# Patient Record
Sex: Female | Born: 1995 | Hispanic: Refuse to answer | Marital: Single | Smoking: Never smoker
Health system: Southern US, Community
[De-identification: ages and names within clinical notes are randomized; demographics above are authoritative.]

---

## 2017-11-05 ENCOUNTER — Other Ambulatory Visit: Payer: Self-pay | Admitting: Family Medicine

## 2017-11-05 ENCOUNTER — Other Ambulatory Visit: Payer: Self-pay

## 2017-11-05 DIAGNOSIS — M84376A Stress fracture, unspecified foot, initial encounter for fracture: Secondary | ICD-10-CM

## 2017-11-06 LAB — VITAMIN D 25 HYDROXY (VIT D DEFICIENCY, FRACTURES): VIT D 25 HYDROXY: 48.5 ng/mL (ref 30.0–100.0)

## 2017-11-06 LAB — SPECIMEN STATUS REPORT

## 2017-11-12 ENCOUNTER — Ambulatory Visit
Admission: RE | Admit: 2017-11-12 | Discharge: 2017-11-12 | Disposition: A | Discharge status: Home / Self Care | Payer: PRIVATE HEALTH INSURANCE | Source: Ambulatory Visit | Attending: Family Medicine | Admitting: Family Medicine

## 2017-11-12 ENCOUNTER — Other Ambulatory Visit: Payer: Self-pay | Admitting: Family Medicine

## 2017-11-12 DIAGNOSIS — M84375S Stress fracture, left foot, sequela: Secondary | ICD-10-CM | POA: Diagnosis not present

## 2017-11-12 DIAGNOSIS — M79672 Pain in left foot: Secondary | ICD-10-CM | POA: Diagnosis present

## 2017-11-12 DIAGNOSIS — X58XXXS Exposure to other specified factors, sequela: Secondary | ICD-10-CM | POA: Diagnosis not present

## 2017-11-18 ENCOUNTER — Ambulatory Visit
Admission: RE | Admit: 2017-11-18 | Discharge: 2017-11-18 | Disposition: A | Discharge status: Home / Self Care | Payer: PRIVATE HEALTH INSURANCE | Source: Ambulatory Visit | Attending: Family Medicine | Admitting: Family Medicine

## 2017-11-18 ENCOUNTER — Other Ambulatory Visit: Payer: Self-pay | Admitting: Family Medicine

## 2017-11-18 DIAGNOSIS — M84375A Stress fracture, left foot, initial encounter for fracture: Secondary | ICD-10-CM

## 2018-01-14 ENCOUNTER — Ambulatory Visit (INDEPENDENT_AMBULATORY_CARE_PROVIDER_SITE_OTHER): Payer: PRIVATE HEALTH INSURANCE | Admitting: Family Medicine

## 2018-01-14 ENCOUNTER — Encounter: Payer: Self-pay | Admitting: Family Medicine

## 2018-01-14 VITALS — BP 115/60 | HR 59 | Temp 98.7°F | Resp 14

## 2018-01-14 DIAGNOSIS — D508 Other iron deficiency anemias: Secondary | ICD-10-CM

## 2018-01-14 NOTE — Progress Notes (Signed)
Patient presents today for follow-up regarding her vitamin D level.  Patient states that she currently takes a vitamin D supplement however does not know how many units it is.  She also does take iron is been told in the past that she had iron deficiency anemia.  Currently she denies any fatigue, menstrual irregularities, chest pain, shortness of breath.  She denies any restrictions in her diet.  She has been dealing with a chronic stress injury in her left foot.  She is decreasing her running progression.  But by Dr. Ardine Eng regarding this. Today she denies any pain of her left foot.  ROS: Negative except mentioned above. Vitals as per Epic. GENERAL: NAD NEURO: CN II-XII grossly intact   A/P: History of Stress Injury to Left Foot - Vitamin D3 was checked in August and was 48.  Encouraged patient to continue Vitamin D3 2000 IUs daily.  Can recheck Vitamin D level in January.  Follow plan by Dr. Ardine Eng regarding treatment related to stress injury.  History of Iron Deficiency Anemia -we will check ferritin and iron studies, continue iron supplement for now, will inform patient if changes are needed to supplement.

## 2018-01-15 LAB — IRON AND TIBC
Iron Saturation: 31 % (ref 15–55)
Iron: 104 ug/dL (ref 27–159)
TIBC: 332 ug/dL (ref 250–450)
UIBC: 228 ug/dL (ref 131–425)

## 2018-01-15 LAB — FERRITIN: Ferritin: 51 ng/mL (ref 15–150)

## 2018-04-19 ENCOUNTER — Ambulatory Visit (INDEPENDENT_AMBULATORY_CARE_PROVIDER_SITE_OTHER): Payer: PRIVATE HEALTH INSURANCE | Admitting: Family Medicine

## 2018-04-19 ENCOUNTER — Encounter: Payer: Self-pay | Admitting: Family Medicine

## 2018-04-19 VITALS — BP 118/72 | HR 86 | Temp 98.3°F | Resp 14

## 2018-04-19 DIAGNOSIS — J101 Influenza due to other identified influenza virus with other respiratory manifestations: Secondary | ICD-10-CM

## 2018-04-19 DIAGNOSIS — J029 Acute pharyngitis, unspecified: Secondary | ICD-10-CM

## 2018-04-19 LAB — POCT INFLUENZA A/B
INFLUENZA A, POC: POSITIVE — AB
Influenza B, POC: NEGATIVE

## 2018-04-19 LAB — POCT RAPID STREP A (OFFICE): RAPID STREP A SCREEN: NEGATIVE

## 2018-04-19 MED ORDER — OSELTAMIVIR PHOSPHATE 75 MG PO CAPS: 75.0000 mg | ORAL_CAPSULE | Freq: Two times a day (BID) | ORAL | 0 refills | Status: AC

## 2018-04-19 NOTE — Progress Notes (Signed)
Patient presents today with symptoms of sore throat, cough, night sweats, myalgias.  Patient has had symptoms once yesterday.  She has taken some fever lowering medication at 3 AM this morning.  She does admit to getting the flu vaccination in November.  She denies any chest pain, shortness of breath, nausea, vomiting, diarrhea, abdominal pain, neck stiffness, photophobia.  She is unaware of any sick contacts.  She was doing athletic activity up until today.  ROS: Negative except mentioned above. Vitals as per Epic. GENERAL: NAD HEENT: mild pharyngeal erythema, no exudate, no erythema of TMs, positive scarring from previous infections right TM, no cervical LAD RESP: CTA B CARD: RRR NEURO: CN II-XII grossly intact   A/P: Influenza A - rapid strep test and influenza screen were done, influenza A positive, Tamiflu prescribed, rest, hydration, Tylenol/Ibuprofen as needed, Tussin as needed, no athletic activity or class until afebrile for 24 hours without taking fever lowering medication, seek medical attention if symptoms persist or worsen as discussed

## 2018-05-10 ENCOUNTER — Ambulatory Visit (INDEPENDENT_AMBULATORY_CARE_PROVIDER_SITE_OTHER): Payer: PRIVATE HEALTH INSURANCE | Admitting: Family Medicine

## 2018-05-10 VITALS — BP 114/57 | HR 60 | Temp 98.3°F | Resp 14

## 2018-05-10 DIAGNOSIS — E559 Vitamin D deficiency, unspecified: Secondary | ICD-10-CM | POA: Diagnosis not present

## 2018-05-10 DIAGNOSIS — J069 Acute upper respiratory infection, unspecified: Secondary | ICD-10-CM

## 2018-05-10 DIAGNOSIS — M79672 Pain in left foot: Secondary | ICD-10-CM | POA: Diagnosis not present

## 2018-05-10 NOTE — Progress Notes (Signed)
Patient presents today with left foot pain.  Patient states that her foot pain began on Friday.  She believes that her symptoms started because she tried new shoes and ran 6 miles in them.  She denies any pain to her foot prior to Friday.  Since that time she has taken time off from running and her symptoms have decreased considerably.  She notices some discomfort after activity.  Patient has a history of a known navicular fracture where she describes the pain as being.  She denies any pain walking at this time.  She denies having any bruising or swelling of the area recently. She denies any changes in her menstrual cycle or weight.  Her last vitamin D level was checked 10/2017 and was 49.  She has been taking vitamin D3 5000 IUs every other day. Patient also admits to having rhinorrhea and cough for the last few days.  She admits that she was in [place] for the meet this past weekend when her symptoms began.  She denies any fever, chest pain, shortness of breath, nausea, vomiting, diarrhea, headache.  The mucus has been mostly clear.  Any history of asthma.  She has not taken any medications for symptoms.   ROS: Negative except mentioned above. Vitals as per Epic. GENERAL: NAD HEENT: minimal pharyngeal erythema, no exudate, no erythema of TMs, no cervical LAD RESP: CTA B CARD: RRR MSK: Left Foot -no swelling or ecchymosis appreciated, no significant tenderness to palpation, FROM, negative hop test, normal gait, N/V intact NEURO: CN II-XII grossly intact   A/P: 1)Left Foot Pain -patient describes the pain as being over the navicular however today on exam she does not have any significant pain, would recommend that patient decrease mileage and run on even surfaces, would also recommend staying in running shoes that she has used in the past, encouraged changing out running shoes every 400 miles, would build in more crosstraining days and day off within her running schedule, vitamin D checked today, if  patient's pain persist or worsens by the end of the week will place patient in a walking boot and do imaging, patient addresses understanding of plan, will discuss above with athletic trainer.  2)Viral URI -recommend symptomatic relief with over-the-counter medicines such as Claritin, Delsym, Tylenol/Ibuprofen, rest, hydration, can go to class and do athletic activity if afebrile for 24 hours without taking fever lowering medication, if symptoms do persist or worsen recommend seeking medical attention, patient addresses understanding of plan.

## 2018-05-11 LAB — VITAMIN D 25 HYDROXY (VIT D DEFICIENCY, FRACTURES): VIT D 25 HYDROXY: 63.8 ng/mL (ref 30.0–100.0)

## 2018-05-23 ENCOUNTER — Other Ambulatory Visit: Payer: Self-pay | Admitting: Family Medicine

## 2018-05-23 DIAGNOSIS — M79672 Pain in left foot: Secondary | ICD-10-CM

## 2018-05-24 ENCOUNTER — Ambulatory Visit: Admission: RE | Admit: 2018-05-24 | Payer: PRIVATE HEALTH INSURANCE | Source: Ambulatory Visit

## 2018-05-24 ENCOUNTER — Other Ambulatory Visit: Payer: Self-pay

## 2018-05-24 ENCOUNTER — Ambulatory Visit
Admission: RE | Admit: 2018-05-24 | Discharge: 2018-05-24 | Disposition: A | Discharge status: Home / Self Care | Payer: PRIVATE HEALTH INSURANCE | Source: Ambulatory Visit | Attending: Family Medicine | Admitting: Family Medicine

## 2018-05-24 DIAGNOSIS — M79672 Pain in left foot: Secondary | ICD-10-CM | POA: Insufficient documentation

## 2018-05-27 ENCOUNTER — Ambulatory Visit: Payer: PRIVATE HEALTH INSURANCE

## 2019-01-12 ENCOUNTER — Other Ambulatory Visit: Payer: PRIVATE HEALTH INSURANCE | Admitting: Family Medicine

## 2019-01-12 ENCOUNTER — Other Ambulatory Visit: Payer: Self-pay

## 2019-01-12 DIAGNOSIS — R5383 Other fatigue: Secondary | ICD-10-CM

## 2019-01-13 LAB — SPECIMEN STATUS

## 2019-01-13 LAB — IRON AND TIBC
Iron Saturation: 52 % (ref 15–55)
Iron: 201 ug/dL — ABNORMAL HIGH (ref 27–159)
Total Iron Binding Capacity: 390 ug/dL (ref 250–450)
UIBC: 189 ug/dL (ref 131–425)

## 2019-01-13 LAB — FERRITIN: Ferritin: 62 ng/mL (ref 15–150)

## 2019-01-13 LAB — VITAMIN D 25 HYDROXY (VIT D DEFICIENCY, FRACTURES): Vit D, 25-Hydroxy: 45.8 ng/mL (ref 30.0–100.0)

## 2019-01-20 ENCOUNTER — Other Ambulatory Visit: Payer: Self-pay

## 2019-01-20 ENCOUNTER — Telehealth: Payer: PRIVATE HEALTH INSURANCE | Admitting: Family Medicine

## 2019-01-20 DIAGNOSIS — R5383 Other fatigue: Secondary | ICD-10-CM

## 2019-01-21 NOTE — Progress Notes (Signed)
Virtual Visit Agreed To By Patient   Patient states that she has had soreness. and fatigue after activity. Her coach feels she isn't able to recover quick enough. She has had some inconsistent training because of the restrictions with training due to COVID-19. Patient has not had COVID-19. She feels her body just needs to adjust back to increasing activity. She denies any changes in weight, sleep, appetite. She takes iron and magnesium. Has some muscle soreness 1-2 days after training. She says the soreness depends on what muscle group she is using. On occasion in the past has seen a ?drop of blood in urine but no "tea colored" urine or hematuria recently.   ROS: Negative except mentioned above. GENERAL: NAD No Exam.  A/P: Fatigue/Recovery Issues - concerns from coach per patient, lab results reviewed, ferritin is fine, serum iron slightly elevated, discouraged taking too much iron, Vitamin D in the 40s, will restart her Vitamin D3 supplement, recheck in 3 months, encouraged balanced diet, consider referral to nutritionist, hydrating with electrolyte beverage if doing >45 min aerobic activity, have cross-training and off days built into training, avoid increasing mileage >10% a week. Will discuss above with trainer as well. Follow-up in January, sooner if needed.

## 2019-07-31 ENCOUNTER — Other Ambulatory Visit: Payer: Self-pay

## 2019-07-31 ENCOUNTER — Other Ambulatory Visit: Payer: PRIVATE HEALTH INSURANCE | Admitting: Family

## 2019-07-31 VITALS — BP 113/66 | HR 69 | Temp 98.6°F | Resp 14

## 2019-07-31 DIAGNOSIS — Z130 Encounter for screening for diseases of the blood and blood-forming organs and certain disorders involving the immune mechanism: Secondary | ICD-10-CM

## 2019-07-31 DIAGNOSIS — D508 Other iron deficiency anemias: Secondary | ICD-10-CM

## 2019-07-31 DIAGNOSIS — D649 Anemia, unspecified: Secondary | ICD-10-CM | POA: Insufficient documentation

## 2019-07-31 NOTE — Progress Notes (Signed)
   Established Patient Office Visit  Subjective:  Patient ID: Shelby Gill, female    DOB: [birth year]  Age: 24 y.o. MRN: [id number]  CC:  Chief Complaint  Patient presents with  . Labs Only    Iron check.    HPI Shaneece Stockburger presents for iron blood test.  Pt has a history of iron deficiency anemia. She takes an iron supplement-Kendural-C which is a [place] otc supplement with 329 mg iron.  Pt feels well without any side effects of iron deficiency.  Pt want's to know if she should stop her supplementation.  No past medical history on file.  No past surgical history on file.  No family history on file.  No Known Allergies  ROS Review of Systems  Constitutional: Negative for activity change and fatigue.      Objective:    Physical Exam  Constitutional: She appears well-developed and well-nourished.    BP 113/66   Pulse 69   Temp 98.6 F (37 C) (Temporal)   Resp 14   SpO2 97%  Wt Readings from Last 3 Encounters:  No data found for Wt      Assessment & Plan:   Problem List Items Addressed This Visit    None    Visit Diagnoses    Screening, iron deficiency anemia    -  Primary   Relevant Orders   CBC with Differential   Ferritin   Iron and TIBC([facility])     Reviewed HPI and request. Will run CBC, Ferritin and Iron/TIBC. Will email results to pt.  Rtc prn.   Shantese Raven, Deirdre Peer, NP

## 2019-08-01 LAB — CBC WITH DIFFERENTIAL/PLATELET
Basophils Absolute: 0 10*3/uL (ref 0.0–0.2)
Basos: 1 %
EOS (ABSOLUTE): 0.1 10*3/uL (ref 0.0–0.4)
Eos: 1 %
Hematocrit: 46.8 % — ABNORMAL HIGH (ref 34.0–46.6)
Hemoglobin: 15.7 g/dL (ref 11.1–15.9)
Immature Grans (Abs): 0 10*3/uL (ref 0.0–0.1)
Immature Granulocytes: 0 %
Lymphocytes Absolute: 2 10*3/uL (ref 0.7–3.1)
Lymphs: 35 %
MCH: 31.8 pg (ref 26.6–33.0)
MCHC: 33.5 g/dL (ref 31.5–35.7)
MCV: 95 fL (ref 79–97)
Monocytes Absolute: 0.5 10*3/uL (ref 0.1–0.9)
Monocytes: 8 %
Neutrophils Absolute: 3.2 10*3/uL (ref 1.4–7.0)
Neutrophils: 55 %
Platelets: 235 10*3/uL (ref 150–450)
RBC: 4.93 x10E6/uL (ref 3.77–5.28)
RDW: 12.5 % (ref 11.7–15.4)
WBC: 5.8 10*3/uL (ref 3.4–10.8)

## 2019-08-01 LAB — IRON AND TIBC
Iron Saturation: 50 % (ref 15–55)
Iron: 175 ug/dL — ABNORMAL HIGH (ref 27–159)
Total Iron Binding Capacity: 350 ug/dL (ref 250–450)
UIBC: 175 ug/dL (ref 131–425)

## 2019-08-01 LAB — FERRITIN: Ferritin: 73 ng/mL (ref 15–150)

## 2020-10-01 IMAGING — MR MR FOOT*L* W/O CM
5 series · 40 of 40 positions shown · non-contrast
Comparison: CT scan 11/18/2017

CLINICAL DATA: History of navicular stress fracture [REDACTED] 4844.
Patient is a distance runner at [employer]. Persistent pain on the
top of the left foot.

EXAM:
MRI OF THE LEFT FOOT WITHOUT CONTRAST
TECHNIQUE: Multiplanar, multisequence MR imaging of the left foot was
performed. No intravenous contrast was administered.

[Series 4: PD fat-sat · axial · left · 3.0mm · 0.50mm/px · z∈[-98,+41]mm · 9 of 36 slices shown]
[im 1/36]
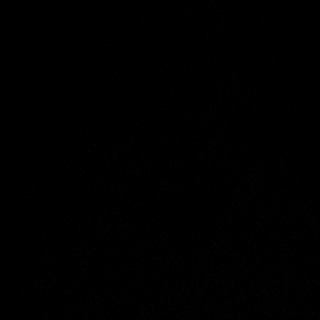
[im 5/36]
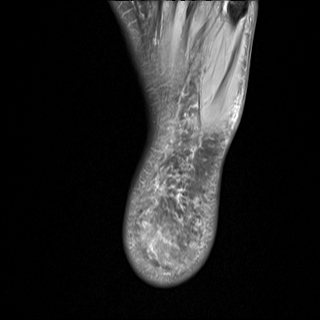
[im 9/36]
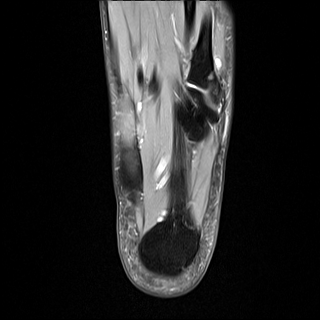
[im 14/36]
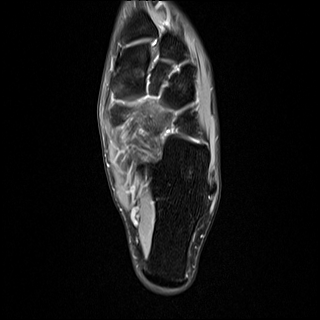
[im 18/36]
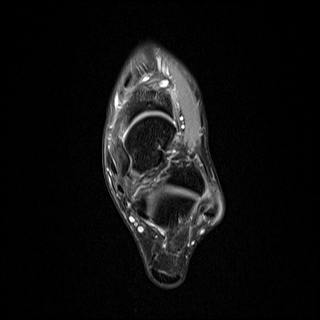
[im 22/36]
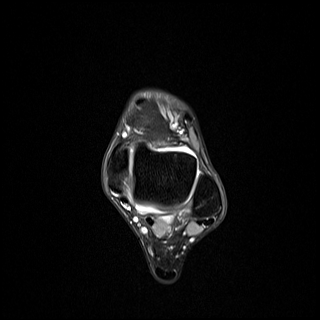
[im 27/36]
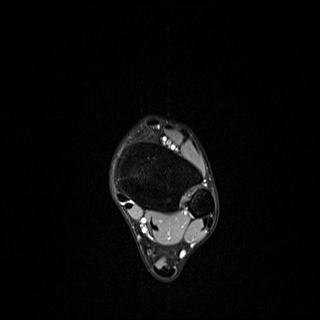
[im 31/36]
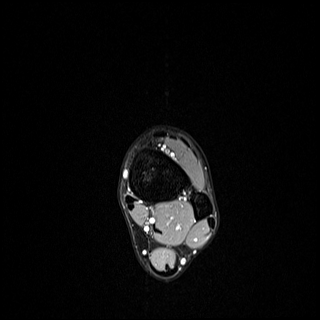
[im 36/36]
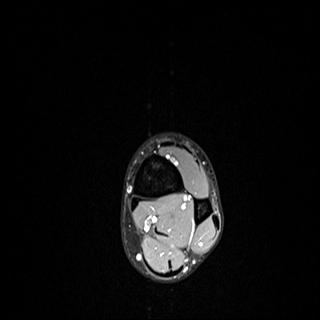

[Series 5: T2 fat-sat · axial · left · 3.0mm · 0.50mm/px · z∈[-98,+41]mm · 10 of 36 slices shown (1 of 2)]
[im 1/36]
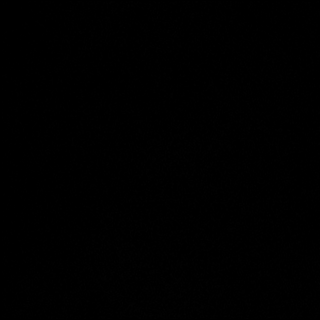
[im 4/36]
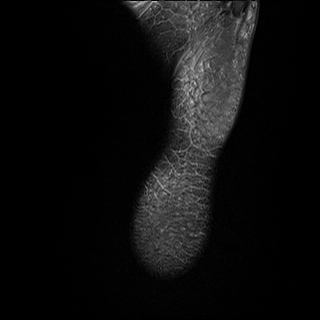
[im 8/36]
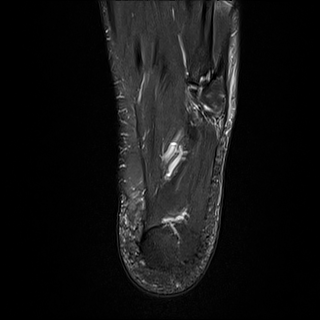
[im 12/36]
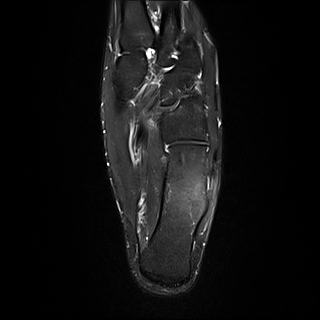
[im 16/36]
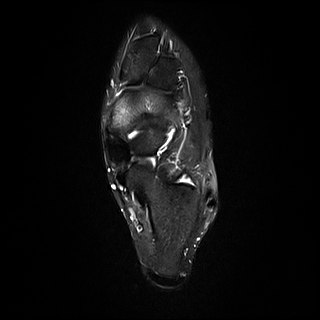
[im 20/36]
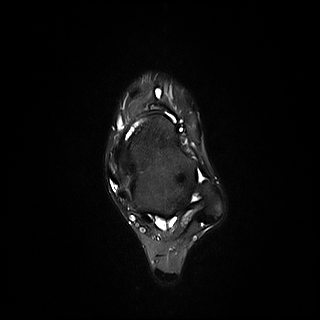
[im 24/36]
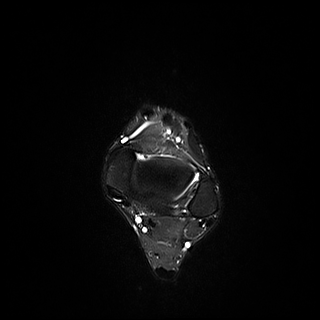
[im 28/36]
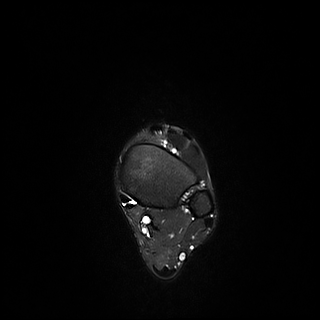
[im 32/36]
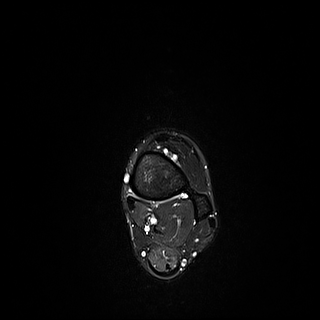
[im 36/36]
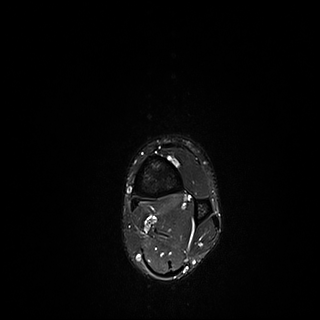

[Series 6: T2 fat-sat · coronal · left · 3.0mm · 0.62mm/px · 11 of 40 slices shown (2 of 2)]
[im 1/40]
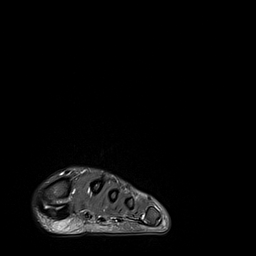
[im 4/40]
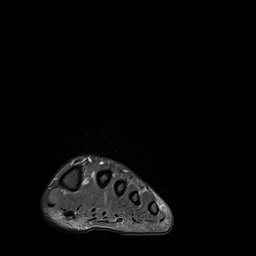
[im 8/40]
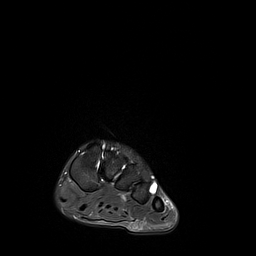
[im 12/40]
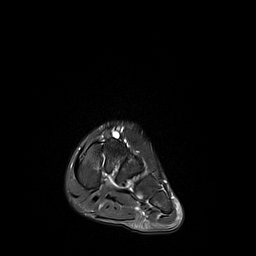
[im 16/40]
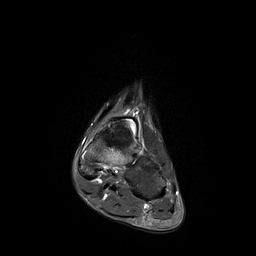
[im 20/40]
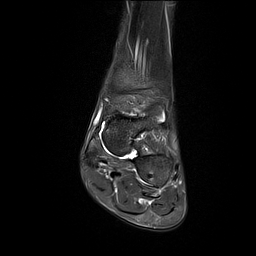
[im 24/40]
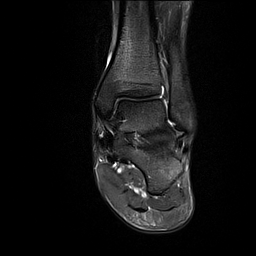
[im 28/40]
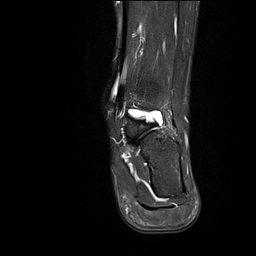
[im 32/40]
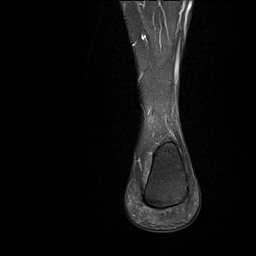
[im 36/40]
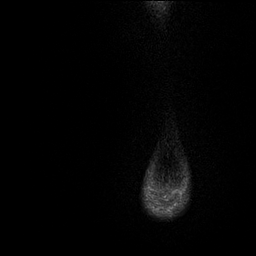
[im 40/40]
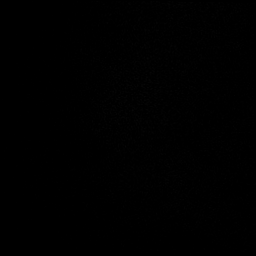

[Series 7: T1 · sagittal · left · 4.0mm · 0.70mm/px · 5 of 18 slices shown]
[im 1/18]
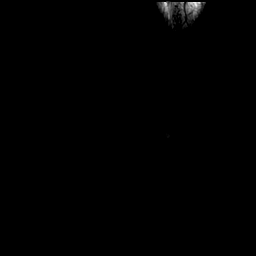
[im 5/18]
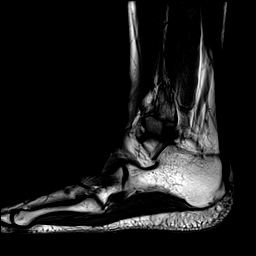
[im 9/18]
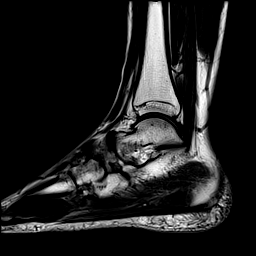
[im 13/18]
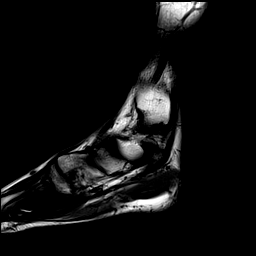
[im 18/18]
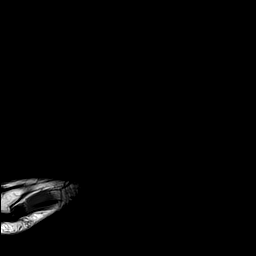

[Series 8: STIR · sagittal · left · 4.0mm · 0.35mm/px · 5 of 18 slices shown]
[im 1/18]
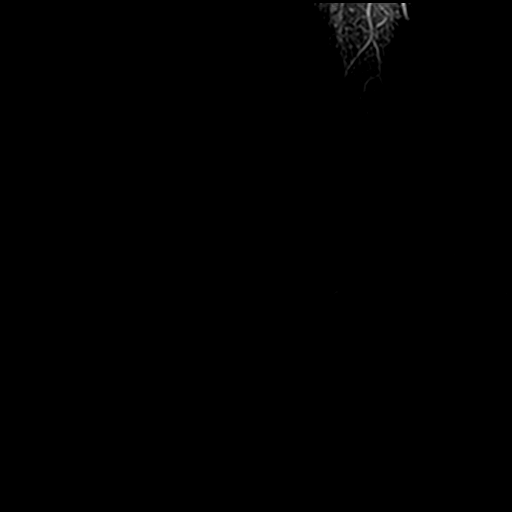
[im 5/18]
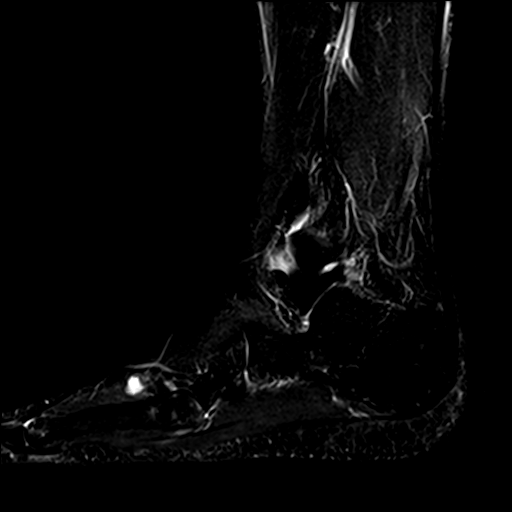
[im 9/18]
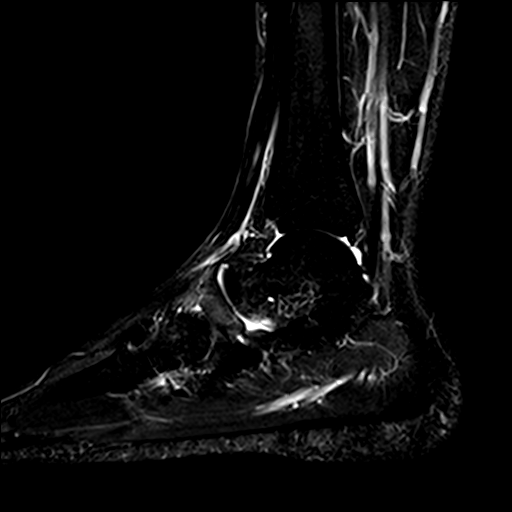
[im 13/18]
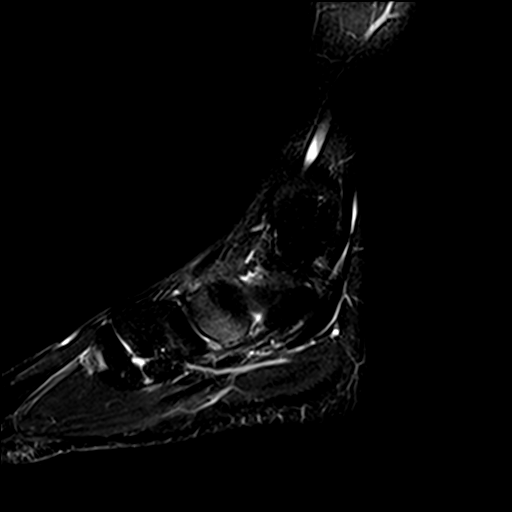
[im 18/18]
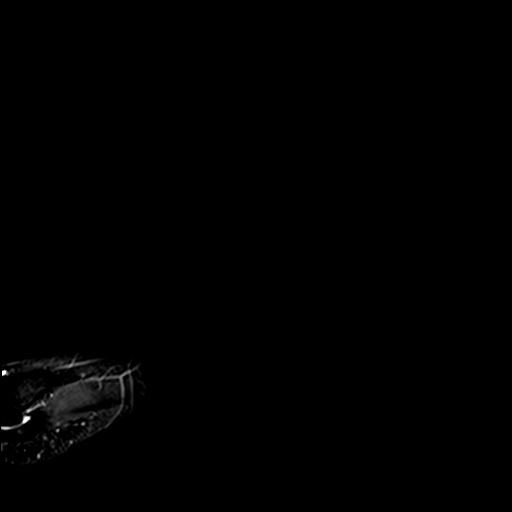

[40 of 40 positions shown; findings below may reference images not displayed]

FINDINGS: There is abnormal increased T2 and STIR signal intensity in the
navicular bone suggesting an ongoing stress related process. There
are also remote changes with a probable prior dorsal navicular
avulsion fracture with ununited fragment. There are also 2 defects
in the subchondral plate at the articulation with the talus. These
could be ununited subcortical fractures or possible osteochondral
lesions.

There is also dorsal degenerative spurring changes involving the
distal aspect of the talus. No talus stress fracture or
osteochondral lesion.

Benign-appearing sclerotic lesions are noted in the talus and
calcaneus which are likely benign bone islands.

There is also a focal fluid collection along the dorsum aspect of
the navicular bone which could be a ganglion cyst or adventitial
bursa.

The other bony structures are intact. Small tibiotalar joint
effusion but no significant degenerative changes, cartilage defects
or osteochondral lesion.

The medial and lateral ankle tendons are intact. Mild tenosynovitis
involving the posterior tibialis tendon and flexor digitorum longus
tendon. The anterior ankle tendons appear normal. The Achilles
tendon is normal. The plantar fascia is intact.

The medial and lateral ankle ligaments are intact.

The subtalar joints are maintained. The sinus tarsi is normal. The
cervical and interosseous ligaments are intact and the spring
ligament is intact.
IMPRESSION: 1. Edema like signal abnormality in the navicular bone suggesting a
persistent stress related process without discrete stress fracture.
2. There are age advanced degenerative changes at the talonavicular
joint with evidence of probable prior ununited dorsal avulsion
fracture and small subchondral defects involving the navicular bone.
Possible prior fractures or osteochondral lesions.
Small fluid collection along the dorsal aspect of the navicular bone
could be a small ganglion cyst or adventitial bursa.
3. Small tibiotalar joint effusion but no degenerative changes or
cartilage abnormality.
4. The major ligaments and tendons are intact.

## 2023-06-18 ENCOUNTER — Other Ambulatory Visit: Payer: Self-pay

## 2023-06-18 ENCOUNTER — Emergency Department
Admission: EM | Admit: 2023-06-18 | Discharge: 2023-06-18 | Disposition: A | Discharge status: Home / Self Care | Payer: PRIVATE HEALTH INSURANCE | Attending: Emergency Medicine | Admitting: Emergency Medicine

## 2023-06-18 DIAGNOSIS — J101 Influenza due to other identified influenza virus with other respiratory manifestations: Secondary | ICD-10-CM | POA: Diagnosis not present

## 2023-06-18 DIAGNOSIS — J029 Acute pharyngitis, unspecified: Secondary | ICD-10-CM

## 2023-06-18 DIAGNOSIS — R509 Fever, unspecified: Secondary | ICD-10-CM | POA: Diagnosis present

## 2023-06-18 LAB — RESP PANEL BY RT-PCR (RSV, FLU A&B, COVID)  RVPGX2
Influenza A by PCR: POSITIVE — AB
Influenza B by PCR: NEGATIVE
Resp Syncytial Virus by PCR: NEGATIVE
SARS Coronavirus 2 by RT PCR: NEGATIVE

## 2023-06-18 LAB — GROUP A STREP BY PCR: Group A Strep by PCR: NOT DETECTED

## 2023-06-18 MED ORDER — LIDOCAINE VISCOUS HCL 2 % MT SOLN
15.0000 mL | Freq: Once | OROMUCOSAL | Status: AC
  Administered 2023-06-18: 15 mL via OROMUCOSAL
  Filled 2023-06-18: qty 15

## 2023-06-18 NOTE — ED Provider Notes (Signed)
 [facility] Provider Note    Event Date/Time   First MD Initiated Contact with Patient 06/18/23 1103     (approximate)   History   Sore Throat   HPI Shelby Gill is a 28 y.o. female for sore throat.  Patient states over the past 2 days she has had sore throat with itching sensation.  Also having low-grade fevers, congestion, and occasional cough.  No significant shortness of breath or chest pain.  No abdominal pain or nausea with vomiting.  No body aches.  No obvious sick contacts but was recently on a plane.     Physical Exam   Triage Vital Signs: ED Triage Vitals  Encounter Vitals Group     BP 06/18/23 1054 116/77     Systolic BP Percentile --      Diastolic BP Percentile --      Pulse Rate 06/18/23 1054 69     Resp 06/18/23 1054 16     Temp 06/18/23 1054 99 F (37.2 C)     Temp Source 06/18/23 1054 Oral     SpO2 06/18/23 1056 100 %     Weight 06/18/23 1053 123 lb 7.3 oz (56 kg)     Height 06/18/23 1053 5' 4.96" (1.65 m)     Head Circumference --      Peak Flow --      Pain Score 06/18/23 1053 8     Pain Loc --      Pain Education --      Exclude from Growth Chart --     Most recent vital signs: Vitals:   06/18/23 1054 06/18/23 1056  BP: 116/77   Pulse: 69   Resp: 16   Temp: 99 F (37.2 C)   SpO2:  100%   I have reviewed the vital signs. General:  Awake, alert, no acute distress. Head:  Normocephalic, Atraumatic. EENT:  PERRL, EOMI, Oral mucosa pink and moist, Neck is supple.  No significant posterior oropharynx erythema, tonsillar exudates, or tonsillar swelling.  No cervical lymphadenopathy. Cardiovascular: Regular rate, 2+ distal pulses. Respiratory:  Normal respiratory effort, symmetrical expansion, no distress.   Extremities:  Moving all four extremities through full ROM without pain.   Neuro:  Alert and oriented.  Interacting appropriately.   Skin:  Warm, dry, no rash.   Psych: Appropriate affect.    ED Results /  Procedures / Treatments   Labs (all labs ordered are listed, but only abnormal results are displayed) Labs Reviewed  GROUP A STREP BY PCR  RESP PANEL BY RT-PCR (RSV, FLU A&B, COVID)  [id number]     EKG    RADIOLOGY    PROCEDURES:  Critical Care performed: No  Procedures   MEDICATIONS ORDERED IN ED: Medications  lidocaine (XYLOCAINE) 2 % viscous mouth solution 15 mL (15 mLs Mouth/Throat Given 06/18/23 1132)     IMPRESSION / MDM / ASSESSMENT AND PLAN / ED COURSE  I reviewed the triage vital signs and the nursing notes.                              Differential diagnosis includes, but is not limited to, COVID, flu, RSV, strep throat, viral pharyngitis  Patient's presentation is most consistent with acute complicated illness / injury requiring diagnostic workup.  Patient is a 28 year old female presenting today for sore throat associated with cough and congestion.  Will obtain swabs looking for strep throat or viral sources.  Given viscous lidocaine to help with sore throat symptoms.  Physical exam and vital signs otherwise unremarkable.  Patient negative for strep.  She had to leave and did not want to wait for the results of her viral swab.  It would not change management at this time and have sent her home with continued symptomatic treatment.  Given strict return precautions for any worsening symptoms and patient is agreeable with plan.     FINAL CLINICAL IMPRESSION(S) / ED DIAGNOSES   Final diagnoses:  Viral pharyngitis     Rx / DC Orders   ED Discharge Orders     None        Note:  This document was prepared using Dragon voice recognition software and may include unintentional dictation errors.   Janith Lima, MD 06/18/23 [phone]

## 2023-06-18 NOTE — Discharge Instructions (Signed)
 You can check your MyChart online for the results of your COVID, flu, and RSV test.  You were negative for strep and do not need antibiotics at this time.  Otherwise continue taking the medications at home and that should hopefully improve in the next several days.

## 2023-06-18 NOTE — ED Triage Notes (Signed)
 Sore throat x 2 days.  Recent air travel from [place].  Denies fever/chills.  Voice clear and strong.
